# Patient Record
Sex: Male | Born: 1955
Health system: Southern US, Community
[De-identification: ages and names within clinical notes are randomized; demographics above are authoritative.]

## PROBLEM LIST (undated history)

## (undated) DIAGNOSIS — G4730 Sleep apnea, unspecified: Secondary | ICD-10-CM

## (undated) DIAGNOSIS — I10 Essential (primary) hypertension: Secondary | ICD-10-CM

## (undated) DIAGNOSIS — E119 Type 2 diabetes mellitus without complications: Secondary | ICD-10-CM

## (undated) DIAGNOSIS — J302 Other seasonal allergic rhinitis: Secondary | ICD-10-CM

---

## 1997-10-19 HISTORY — PX: NASAL FRACTURE SURGERY: SHX718

## 2018-10-24 DIAGNOSIS — Z7984 Long term (current) use of oral hypoglycemic drugs: Secondary | ICD-10-CM | POA: Diagnosis not present

## 2018-10-24 DIAGNOSIS — R1031 Right lower quadrant pain: Secondary | ICD-10-CM | POA: Diagnosis not present

## 2018-10-24 DIAGNOSIS — I10 Essential (primary) hypertension: Secondary | ICD-10-CM | POA: Diagnosis not present

## 2018-10-24 DIAGNOSIS — E1169 Type 2 diabetes mellitus with other specified complication: Secondary | ICD-10-CM | POA: Diagnosis not present

## 2018-11-08 DIAGNOSIS — R1031 Right lower quadrant pain: Secondary | ICD-10-CM | POA: Diagnosis not present

## 2018-11-29 DIAGNOSIS — R1031 Right lower quadrant pain: Secondary | ICD-10-CM | POA: Diagnosis not present

## 2018-12-01 ENCOUNTER — Other Ambulatory Visit: Payer: Self-pay | Admitting: General Surgery

## 2018-12-01 ENCOUNTER — Ambulatory Visit
Admission: RE | Admit: 2018-12-01 | Discharge: 2018-12-01 | Disposition: A | Discharge status: Home / Self Care | Payer: Self-pay | Source: Ambulatory Visit | Attending: General Surgery | Admitting: General Surgery

## 2018-12-01 DIAGNOSIS — N201 Calculus of ureter: Secondary | ICD-10-CM | POA: Diagnosis not present

## 2018-12-01 DIAGNOSIS — K4090 Unilateral inguinal hernia, without obstruction or gangrene, not specified as recurrent: Secondary | ICD-10-CM | POA: Diagnosis not present

## 2018-12-01 DIAGNOSIS — R1031 Right lower quadrant pain: Secondary | ICD-10-CM

## 2018-12-08 ENCOUNTER — Ambulatory Visit: Payer: Self-pay | Admitting: General Surgery

## 2019-01-11 ENCOUNTER — Inpatient Hospital Stay (HOSPITAL_COMMUNITY): Admission: RE | Admit: 2019-01-11 | Payer: Medicare HMO | Source: Ambulatory Visit

## 2019-01-19 ENCOUNTER — Encounter (HOSPITAL_BASED_OUTPATIENT_CLINIC_OR_DEPARTMENT_OTHER): Admission: RE | Payer: Self-pay | Source: Home / Self Care

## 2019-01-19 ENCOUNTER — Ambulatory Visit (HOSPITAL_BASED_OUTPATIENT_CLINIC_OR_DEPARTMENT_OTHER): Admission: RE | Admit: 2019-01-19 | Payer: Medicare HMO | Source: Home / Self Care | Admitting: General Surgery

## 2019-01-19 SURGERY — REPAIR, HERNIA, INGUINAL, BILATERAL, ADULT
Anesthesia: General | Laterality: Bilateral

## 2019-02-17 ENCOUNTER — Ambulatory Visit: Payer: Self-pay | Admitting: General Surgery

## 2019-04-12 ENCOUNTER — Encounter (HOSPITAL_BASED_OUTPATIENT_CLINIC_OR_DEPARTMENT_OTHER): Payer: Self-pay

## 2019-04-12 ENCOUNTER — Other Ambulatory Visit: Payer: Self-pay

## 2019-04-17 ENCOUNTER — Other Ambulatory Visit (HOSPITAL_COMMUNITY)
Admission: RE | Admit: 2019-04-17 | Discharge: 2019-04-17 | Disposition: A | Discharge status: Home / Self Care | Payer: Medicare HMO | Source: Ambulatory Visit | Attending: General Surgery | Admitting: General Surgery

## 2019-04-17 ENCOUNTER — Encounter (HOSPITAL_BASED_OUTPATIENT_CLINIC_OR_DEPARTMENT_OTHER)
Admission: RE | Admit: 2019-04-17 | Discharge: 2019-04-17 | Disposition: A | Discharge status: Home / Self Care | Payer: Medicare HMO | Source: Ambulatory Visit | Attending: General Surgery | Admitting: General Surgery

## 2019-04-17 ENCOUNTER — Other Ambulatory Visit: Payer: Self-pay

## 2019-04-17 DIAGNOSIS — Z1159 Encounter for screening for other viral diseases: Secondary | ICD-10-CM | POA: Diagnosis not present

## 2019-04-17 LAB — BASIC METABOLIC PANEL
Anion gap: 8 (ref 5–15)
BUN: 15 mg/dL (ref 8–23)
CO2: 26 mmol/L (ref 22–32)
Calcium: 9.3 mg/dL (ref 8.9–10.3)
Chloride: 105 mmol/L (ref 98–111)
Creatinine, Ser: 0.86 mg/dL (ref 0.61–1.24)
GFR calc Af Amer: >60 mL/min (ref >60)
GFR calc non Af Amer: >60 mL/min (ref >60)
Glucose, Bld: 128 mg/dL — ABNORMAL HIGH (ref 70–99)
Potassium: 4.6 mmol/L (ref 3.5–5.1)
Sodium: 139 mmol/L (ref 135–145)

## 2019-04-17 LAB — SARS CORONAVIRUS 2 (TAT 6-24 HRS): SARS Coronavirus 2: NEGATIVE

## 2019-04-17 NOTE — Progress Notes (Signed)
Gatorade 2 drink given with instructions to complete by 0400 dos, pt verbalized understanding.

## 2019-04-20 ENCOUNTER — Other Ambulatory Visit: Payer: Self-pay

## 2019-04-20 ENCOUNTER — Ambulatory Visit (HOSPITAL_BASED_OUTPATIENT_CLINIC_OR_DEPARTMENT_OTHER)
Admission: RE | Admit: 2019-04-20 | Discharge: 2019-04-20 | Disposition: A | Discharge status: Home / Self Care | Payer: Medicare HMO | Attending: General Surgery | Admitting: General Surgery

## 2019-04-20 ENCOUNTER — Ambulatory Visit (HOSPITAL_BASED_OUTPATIENT_CLINIC_OR_DEPARTMENT_OTHER): Payer: Medicare HMO | Admitting: Anesthesiology

## 2019-04-20 ENCOUNTER — Encounter (HOSPITAL_BASED_OUTPATIENT_CLINIC_OR_DEPARTMENT_OTHER): Payer: Self-pay | Admitting: Anesthesiology

## 2019-04-20 ENCOUNTER — Encounter (HOSPITAL_BASED_OUTPATIENT_CLINIC_OR_DEPARTMENT_OTHER)
Admission: RE | Disposition: A | Discharge status: Home / Self Care | Payer: Self-pay | Source: Home / Self Care | Attending: General Surgery

## 2019-04-20 DIAGNOSIS — Z7984 Long term (current) use of oral hypoglycemic drugs: Secondary | ICD-10-CM | POA: Diagnosis not present

## 2019-04-20 DIAGNOSIS — G4730 Sleep apnea, unspecified: Secondary | ICD-10-CM | POA: Diagnosis not present

## 2019-04-20 DIAGNOSIS — Z79899 Other long term (current) drug therapy: Secondary | ICD-10-CM | POA: Diagnosis not present

## 2019-04-20 DIAGNOSIS — I10 Essential (primary) hypertension: Secondary | ICD-10-CM | POA: Diagnosis not present

## 2019-04-20 DIAGNOSIS — E119 Type 2 diabetes mellitus without complications: Secondary | ICD-10-CM | POA: Diagnosis not present

## 2019-04-20 DIAGNOSIS — K4020 Bilateral inguinal hernia, without obstruction or gangrene, not specified as recurrent: Secondary | ICD-10-CM | POA: Insufficient documentation

## 2019-04-20 HISTORY — DX: Other seasonal allergic rhinitis: J30.2

## 2019-04-20 HISTORY — PX: INGUINAL HERNIA REPAIR: SHX194

## 2019-04-20 HISTORY — DX: Sleep apnea, unspecified: G47.30

## 2019-04-20 HISTORY — DX: Type 2 diabetes mellitus without complications: E11.9

## 2019-04-20 HISTORY — DX: Essential (primary) hypertension: I10

## 2019-04-20 LAB — GLUCOSE, CAPILLARY: Glucose-Capillary: 129 mg/dL — ABNORMAL HIGH (ref 70–99)

## 2019-04-20 SURGERY — REPAIR, HERNIA, INGUINAL, BILATERAL, ADULT
Anesthesia: General | Site: Groin | Laterality: Bilateral

## 2019-04-20 MED ORDER — PHENYLEPHRINE HCL (PRESSORS) 10 MG/ML IV SOLN
INTRAVENOUS | Status: DC | PRN
  Administered 2019-04-20 (×3): 80 ug via INTRAVENOUS

## 2019-04-20 MED ORDER — SODIUM CHLORIDE (PF) 0.9 % IJ SOLN
INTRAMUSCULAR | Status: AC
  Filled 2019-04-20: qty 10

## 2019-04-20 MED ORDER — BUPIVACAINE LIPOSOME 1.3 % IJ SUSP
INTRAMUSCULAR | Status: AC
  Filled 2019-04-20: qty 20

## 2019-04-20 MED ORDER — BUPIVACAINE-EPINEPHRINE (PF) 0.25% -1:200000 IJ SOLN
INTRAMUSCULAR | Status: AC
  Filled 2019-04-20: qty 90

## 2019-04-20 MED ORDER — HYDROCODONE-ACETAMINOPHEN 5-325 MG PO TABS
1.0000 | ORAL_TABLET | Freq: Once | ORAL | Status: AC
  Administered 2019-04-20: 1 via ORAL

## 2019-04-20 MED ORDER — FENTANYL CITRATE (PF) 100 MCG/2ML IJ SOLN: 50.0000 ug | INTRAMUSCULAR | Status: DC | PRN

## 2019-04-20 MED ORDER — ROCURONIUM BROMIDE 10 MG/ML (PF) SYRINGE
PREFILLED_SYRINGE | INTRAVENOUS | Status: AC
  Filled 2019-04-20: qty 10

## 2019-04-20 MED ORDER — METOCLOPRAMIDE HCL 5 MG/ML IJ SOLN: 10.0000 mg | Freq: Once | INTRAMUSCULAR | Status: DC | PRN

## 2019-04-20 MED ORDER — BUPIVACAINE-EPINEPHRINE 0.25% -1:200000 IJ SOLN
INTRAMUSCULAR | Status: DC | PRN
  Administered 2019-04-20: 10 mL

## 2019-04-20 MED ORDER — PROPOFOL 10 MG/ML IV BOLUS
INTRAVENOUS | Status: DC | PRN
  Administered 2019-04-20: 150 mg via INTRAVENOUS

## 2019-04-20 MED ORDER — EPHEDRINE SULFATE 50 MG/ML IJ SOLN
INTRAMUSCULAR | Status: DC | PRN
  Administered 2019-04-20: 25 mg via INTRAVENOUS
  Administered 2019-04-20 (×2): 10 mg via INTRAVENOUS

## 2019-04-20 MED ORDER — CEFAZOLIN SODIUM-DEXTROSE 2-4 GM/100ML-% IV SOLN
2.0000 g | INTRAVENOUS | Status: AC
  Administered 2019-04-20: 08:00:00 2 g via INTRAVENOUS

## 2019-04-20 MED ORDER — DEXAMETHASONE SODIUM PHOSPHATE 4 MG/ML IJ SOLN
INTRAMUSCULAR | Status: DC | PRN
  Administered 2019-04-20: 10 mg via INTRAVENOUS

## 2019-04-20 MED ORDER — ACETAMINOPHEN 500 MG PO TABS
1000.0000 mg | ORAL_TABLET | ORAL | Status: AC
  Administered 2019-04-20: 1000 mg via ORAL

## 2019-04-20 MED ORDER — CHLORHEXIDINE GLUCONATE CLOTH 2 % EX PADS: 6.0000 | MEDICATED_PAD | Freq: Once | CUTANEOUS | Status: DC

## 2019-04-20 MED ORDER — FENTANYL CITRATE (PF) 100 MCG/2ML IJ SOLN
INTRAMUSCULAR | Status: DC | PRN
  Administered 2019-04-20: 100 ug via INTRAVENOUS

## 2019-04-20 MED ORDER — FENTANYL CITRATE (PF) 100 MCG/2ML IJ SOLN
INTRAMUSCULAR | Status: AC
  Filled 2019-04-20: qty 2

## 2019-04-20 MED ORDER — MEPERIDINE HCL 25 MG/ML IJ SOLN: 6.2500 mg | INTRAMUSCULAR | Status: DC | PRN

## 2019-04-20 MED ORDER — LIDOCAINE 2% (20 MG/ML) 5 ML SYRINGE
INTRAMUSCULAR | Status: DC | PRN
  Administered 2019-04-20: 50 mg via INTRAVENOUS

## 2019-04-20 MED ORDER — GABAPENTIN 300 MG PO CAPS
300.0000 mg | ORAL_CAPSULE | ORAL | Status: AC
  Administered 2019-04-20: 300 mg via ORAL

## 2019-04-20 MED ORDER — CELECOXIB 200 MG PO CAPS
ORAL_CAPSULE | ORAL | Status: AC
  Filled 2019-04-20: qty 1

## 2019-04-20 MED ORDER — MIDAZOLAM HCL 2 MG/2ML IJ SOLN: 1.0000 mg | INTRAMUSCULAR | Status: DC | PRN

## 2019-04-20 MED ORDER — ROCURONIUM BROMIDE 100 MG/10ML IV SOLN
INTRAVENOUS | Status: DC | PRN
  Administered 2019-04-20: 60 mg via INTRAVENOUS
  Administered 2019-04-20: 20 mg via INTRAVENOUS

## 2019-04-20 MED ORDER — ACETAMINOPHEN 500 MG PO TABS
ORAL_TABLET | ORAL | Status: AC
  Filled 2019-04-20: qty 2

## 2019-04-20 MED ORDER — MIDAZOLAM HCL 2 MG/2ML IJ SOLN
INTRAMUSCULAR | Status: AC
  Filled 2019-04-20: qty 2

## 2019-04-20 MED ORDER — LACTATED RINGERS IV SOLN
INTRAVENOUS | Status: DC
  Administered 2019-04-20 (×3): via INTRAVENOUS

## 2019-04-20 MED ORDER — CEFAZOLIN SODIUM-DEXTROSE 2-4 GM/100ML-% IV SOLN
INTRAVENOUS | Status: AC
  Filled 2019-04-20: qty 100

## 2019-04-20 MED ORDER — ONDANSETRON HCL 4 MG/2ML IJ SOLN
INTRAMUSCULAR | Status: DC | PRN
  Administered 2019-04-20: 4 mg via INTRAVENOUS

## 2019-04-20 MED ORDER — BUPIVACAINE LIPOSOME 1.3 % IJ SUSP
INTRAMUSCULAR | Status: DC | PRN
  Administered 2019-04-20: 20 mL

## 2019-04-20 MED ORDER — HYDROCODONE-ACETAMINOPHEN 5-325 MG PO TABS: 1.0000 | ORAL_TABLET | Freq: Four times a day (QID) | ORAL | 0 refills | Status: AC | PRN

## 2019-04-20 MED ORDER — HYDROCODONE-ACETAMINOPHEN 5-325 MG PO TABS
ORAL_TABLET | ORAL | Status: AC
  Filled 2019-04-20: qty 1

## 2019-04-20 MED ORDER — LIDOCAINE 2% (20 MG/ML) 5 ML SYRINGE
INTRAMUSCULAR | Status: AC
  Filled 2019-04-20: qty 5

## 2019-04-20 MED ORDER — DEXAMETHASONE SODIUM PHOSPHATE 10 MG/ML IJ SOLN
INTRAMUSCULAR | Status: AC
  Filled 2019-04-20: qty 1

## 2019-04-20 MED ORDER — MIDAZOLAM HCL 5 MG/5ML IJ SOLN
INTRAMUSCULAR | Status: DC | PRN
  Administered 2019-04-20: 2 mg via INTRAVENOUS

## 2019-04-20 MED ORDER — PROPOFOL 10 MG/ML IV BOLUS
INTRAVENOUS | Status: AC
  Filled 2019-04-20: qty 20

## 2019-04-20 MED ORDER — ONDANSETRON HCL 4 MG/2ML IJ SOLN
INTRAMUSCULAR | Status: AC
  Filled 2019-04-20: qty 2

## 2019-04-20 MED ORDER — SUGAMMADEX SODIUM 200 MG/2ML IV SOLN
INTRAVENOUS | Status: DC | PRN
  Administered 2019-04-20: 173 mg via INTRAVENOUS

## 2019-04-20 MED ORDER — METHYLENE BLUE 0.5 % INJ SOLN
INTRAVENOUS | Status: AC
  Filled 2019-04-20: qty 10

## 2019-04-20 MED ORDER — CELECOXIB 200 MG PO CAPS
200.0000 mg | ORAL_CAPSULE | ORAL | Status: AC
  Administered 2019-04-20: 07:00:00 200 mg via ORAL

## 2019-04-20 MED ORDER — FENTANYL CITRATE (PF) 100 MCG/2ML IJ SOLN
25.0000 ug | INTRAMUSCULAR | Status: DC | PRN
  Administered 2019-04-20: 25 ug via INTRAVENOUS

## 2019-04-20 MED ORDER — GABAPENTIN 300 MG PO CAPS
ORAL_CAPSULE | ORAL | Status: AC
  Filled 2019-04-20: qty 1

## 2019-04-20 SURGICAL SUPPLY — 53 items
BENZOIN TINCTURE PRP APPL 2/3 (GAUZE/BANDAGES/DRESSINGS) IMPLANT
BLADE CLIPPER SURG (BLADE) ×1 IMPLANT
BLADE HEX COATED 2.75 (ELECTRODE) ×2 IMPLANT
BLADE SURG 15 STRL LF DISP TIS (BLADE) ×1 IMPLANT
BLADE SURG 15 STRL SS (BLADE) ×1
CHLORAPREP W/TINT 26 (MISCELLANEOUS) ×2 IMPLANT
COVER BACK TABLE REUSABLE LG (DRAPES) ×2 IMPLANT
COVER MAYO STAND REUSABLE (DRAPES) ×2 IMPLANT
COVER WAND RF STERILE (DRAPES) IMPLANT
DECANTER SPIKE VIAL GLASS SM (MISCELLANEOUS) IMPLANT
DERMABOND ADVANCED (GAUZE/BANDAGES/DRESSINGS) ×1
DERMABOND ADVANCED .7 DNX12 (GAUZE/BANDAGES/DRESSINGS) ×1 IMPLANT
DRAIN PENROSE 1/2X12 LTX STRL (WOUND CARE) ×2 IMPLANT
DRAPE LAPAROTOMY TRNSV 102X78 (DRAPES) ×2 IMPLANT
DRAPE UTILITY XL STRL (DRAPES) ×2 IMPLANT
ELECT REM PT RETURN 9FT ADLT (ELECTROSURGICAL) ×2
ELECTRODE REM PT RTRN 9FT ADLT (ELECTROSURGICAL) ×1 IMPLANT
GLOVE BIO SURGEON STRL SZ7.5 (GLOVE) ×2 IMPLANT
GLOVE BIOGEL PI IND STRL 7.0 (GLOVE) IMPLANT
GLOVE BIOGEL PI INDICATOR 7.0 (GLOVE) ×1
GLOVE EXAM NITRILE MD LF STRL (GLOVE) ×1 IMPLANT
GLOVE SURG SYN 7.5  E (GLOVE) ×1
GLOVE SURG SYN 7.5 E (GLOVE) ×1 IMPLANT
GLOVE SURG SYN 7.5 PF PI (GLOVE) IMPLANT
GOWN STRL REUS W/ TWL LRG LVL3 (GOWN DISPOSABLE) ×2 IMPLANT
GOWN STRL REUS W/ TWL XL LVL3 (GOWN DISPOSABLE) IMPLANT
GOWN STRL REUS W/TWL LRG LVL3 (GOWN DISPOSABLE) ×1
GOWN STRL REUS W/TWL XL LVL3 (GOWN DISPOSABLE) ×1
MESH ULTRAPRO 3X6 7.6X15CM (Mesh General) ×2 IMPLANT
NDL HYPO 25X1 1.5 SAFETY (NEEDLE) ×1 IMPLANT
NEEDLE HYPO 22GX1.5 SAFETY (NEEDLE) IMPLANT
NEEDLE HYPO 25X1 1.5 SAFETY (NEEDLE) ×2 IMPLANT
NS IRRIG 1000ML POUR BTL (IV SOLUTION) ×2 IMPLANT
PACK BASIN DAY SURGERY FS (CUSTOM PROCEDURE TRAY) ×2 IMPLANT
PENCIL BUTTON HOLSTER BLD 10FT (ELECTRODE) ×2 IMPLANT
SLEEVE SCD COMPRESS KNEE MED (MISCELLANEOUS) ×2 IMPLANT
SPONGE LAP 18X18 RF (DISPOSABLE) ×2 IMPLANT
STRIP CLOSURE SKIN 1/2X4 (GAUZE/BANDAGES/DRESSINGS) IMPLANT
SUT MON AB 4-0 PC3 18 (SUTURE) ×3 IMPLANT
SUT PROLENE 2 0 SH DA (SUTURE) ×8 IMPLANT
SUT SILK 2 0 SH (SUTURE) ×2 IMPLANT
SUT SILK 3 0 SH 30 (SUTURE) IMPLANT
SUT SILK 3 0 TIES 17X18 (SUTURE) ×1
SUT SILK 3-0 18XBRD TIE BLK (SUTURE) ×1 IMPLANT
SUT VIC AB 0 SH 27 (SUTURE) ×2 IMPLANT
SUT VIC AB 2-0 SH 27 (SUTURE) ×2
SUT VIC AB 2-0 SH 27XBRD (SUTURE) ×1 IMPLANT
SUT VIC AB 3-0 SH 27 (SUTURE) ×2
SUT VIC AB 3-0 SH 27X BRD (SUTURE) ×1 IMPLANT
SYR CONTROL 10ML LL (SYRINGE) ×2 IMPLANT
TOWEL GREEN STERILE FF (TOWEL DISPOSABLE) ×2 IMPLANT
TUBE CONNECTING 20X1/4 (TUBING) ×1 IMPLANT
YANKAUER SUCT BULB TIP NO VENT (SUCTIONS) ×1 IMPLANT

## 2019-04-20 NOTE — Anesthesia Procedure Notes (Signed)
Procedure Name: Intubation Date/Time: 04/20/2019 8:20 AM Performed by: Marrianne Mood, CRNA Pre-anesthesia Checklist: Patient identified, Emergency Drugs available, Suction available, Patient being monitored and Timeout performed Patient Re-evaluated:Patient Re-evaluated prior to induction Oxygen Delivery Method: Circle system utilized Preoxygenation: Pre-oxygenation with 100% oxygen Induction Type: IV induction Ventilation: Mask ventilation without difficulty Laryngoscope Size: Glidescope and 3 Grade View: Grade IV Tube type: Oral Tube size: 8.0 mm Number of attempts: 2 Airway Equipment and Method: Stylet and Oral airway Placement Confirmation: ETT inserted through vocal cords under direct vision,  positive ETCO2 and breath sounds checked- equal and bilateral Secured at: 22 cm Tube secured with: Tape Dental Injury: Teeth and Oropharynx as per pre-operative assessment  Difficulty Due To: Difficulty was unanticipated, Difficult Airway- due to reduced neck mobility and Difficult Airway- due to limited oral opening

## 2019-04-20 NOTE — Op Note (Signed)
04/20/2019  10:35 AM  PATIENT:  Francisco Soto  63 y.o. male  PRE-OPERATIVE DIAGNOSIS:  BILATERAL INGUINAL HERNIAS  POST-OPERATIVE DIAGNOSIS:  BILATERAL INGUINAL HERNIAS  PROCEDURE:  Procedure(s): BILATERAL INGUINAL HERNIA REPAIR WITH MESH (Bilateral)  SURGEON:  Surgeon(s) and Role:    * Jovita Kussmaul, MD - Primary  PHYSICIAN ASSISTANT:   ASSISTANTS: none   ANESTHESIA:   local and general  EBL:  30 mL   BLOOD ADMINISTERED:none  DRAINS: none   LOCAL MEDICATIONS USED:  MARCAINE     SPECIMEN:  No Specimen  DISPOSITION OF SPECIMEN:  N/A  COUNTS:  YES  TOURNIQUET:  * No tourniquets in log *  DICTATION: .Dragon Dictation   After informed consent was obtained the patient was brought to the operating room and placed in the supine position on the operating table.  After adequate induction of general anesthesia the patient's bilateral groin and abdomen area were prepped with ChloraPrep, allowed to dry, and draped in usual sterile manner.  An appropriate timeout was performed.  Both groins were then infiltrated with quarter percent Marcaine.  Attention was first turned to the right side.  A small incision was made from the edge of the pubic tubercle on the right towards the anterior superior iliac spine with a 15 blade knife.  The incision was carried through the skin and subcutaneous tissue sharply with the electrocautery until the fascia of the external oblique was encountered.  A small bridging vein was clamped with hemostats, divided, and ligated with 3-0 silk ties.  The external oblique fascia was then opened along its fibers towards the apex of the external ring with a 15 blade knife and Metzenbaum scissors.  Male [place] retractor was deployed.  Blunt dissection was carried out of the cord structures until they could be surrounded between 2 fingers.  Half inch Penrose drain was placed around the cord structures for retraction purposes.  The cord was then gently skeletonized.  There  was a significant lipoma of the cord that was removed.  The patient appeared to have more of a direct hernia.  During the dissection the ilioinguinal nerve was identified and it seemed to be involved with some scar tissue.  The nerve was dissected out proximally distally, clamped, divided, and ligated with 3-0 silk ties.  The hernia was gently separated from the cord structures taking care to preserve the vas deferens and testicular artery.  The hernia was broad-based coming through the floor of the canal.  The hernia was reduced and the floor of the canal was repaired with interrupted 0 Vicryl stitches.  A 3 x 6 piece of ultra Pro mesh was then chosen and cut to the appropriate size.  The mesh was sewed inferiorly to the shelving edge of the inguinal ligament with a running 2-0 Prolene stitch.  Tails were cut in the mesh laterally and the tails were wrapped around the cord structures.  Superiorly the mesh was sewed to the musculoaponeurotic strength of the transversalis with interrupted 2-0 Prolene vertical mattress stitches.  Lateral to the cord the tails of the mesh were anchored to the shelving edge of the inguinal ligament with an interrupted 2-0 Prolene stitch.  Once this was accomplished the mesh appeared to be in good position the hernia seem well repaired.  The wound was irrigated with copious amounts of saline.  The external oblique fascia was then reapproximated with a running 2-0 Vicryl stitch.  The wound was infiltrated with Exparel.  The subcutaneous fascia was then  closed with a running 3-0 Vicryl stitch.  The skin was closed with a running 4-0 Monocryl subcuticular stitch.  Attention was then turned to the left breast.  A similar incision was made from the edge of the pubic tubercle towards the anterior superior iliac spine.  The incision was carried through the skin and subcutaneous tissue sharply with electrocautery until the fascia of the external oblique was encountered.  2 small bridging veins  were clamped with hemostats, divided, and ligated with 3-0 silk ties.  The external oblique fascia was opened along its fibers towards the apex of the external ring with a 15 blade knife and Metzenbaum scissors.  A [place] retractor was deployed.  Blunt dissection was carried out of the cord structures until they could be surrounded between 2 fingers.  1/2 inch Penrose drain was placed around the cord structures for retraction purposes.  The cord was then gently skeletonized by blunt dissection.  A lipoma of the cord was again identified and separated from the cord structures and removed.  The hernia seem to be coming through the floor the canal and was very broad-based.  Because of this it was reduced and the floor the canal was repaired with interrupted 0 Vicryl stitches.  A 3 x 6 piece of ultra Pro mesh was chosen and cut to the appropriate size.  The mesh was sewed inferiorly to the shelving edge of the inguinal ligament with a running 2-0 Prolene stitch.  Tails were cut in the mesh laterally and the tails were wrapped around the cord structures.  Superiorly the mesh was sewed to the musculoaponeurotic strength of the transversalis with interrupted 2-0 Prolene vertical mattress stitches.  Lateral to the cord the tails of the mesh were anchored to the shelving edge of the lingual ligament with interrupted 2-0 Prolene stitch.  Once this was accomplished the mesh appeared to be in good position and the hernia seem well repaired.  The wound was irrigated with copious amounts of saline.  The ilioinguinal nerve was identified during the dissection and appeared to be involved with scar tissue.  The nerve was dissected out proximally and distally, clamped, divided, and ligated with 3-0 silk ties.  Next the wound was infiltrated with Exparel.  The external oblique fascia was reapproximated with a running 2-0 Vicryl stitch.  The subcutaneous fascia was closed with a running 3-0 Vicryl stitch.  The skin was then closed  with a running 4-0 Monocryl subcuticular stitch.  Dermabond dressings were applied.  The patient tolerated the procedure well.  At the end of the case all needle sponge and instrument counts were correct.  The patient was then awakened and taken to recovery in stable condition.  The patient's testicles were in the scrotum at the end of the case.  PLAN OF CARE: Discharge to home after PACU  PATIENT DISPOSITION:  PACU - hemodynamically stable.   Delay start of Pharmacological VTE agent (>24hrs) due to surgical blood loss or risk of bleeding: not applicable

## 2019-04-20 NOTE — Anesthesia Preprocedure Evaluation (Signed)
Anesthesia Evaluation  Patient identified by MRN, date of birth, ID band Patient awake    Reviewed: Allergy & Precautions, NPO status , Patient's Chart, lab work & pertinent test results  Airway Mallampati: II  TM Distance: >3 FB Neck ROM: Full    Dental no notable dental hx.    Pulmonary sleep apnea and Continuous Positive Airway Pressure Ventilation ,    Pulmonary exam normal breath sounds clear to auscultation       Cardiovascular hypertension, Pt. on medications Normal cardiovascular exam Rhythm:Regular Rate:Normal     Neuro/Psych negative neurological ROS  negative psych ROS   GI/Hepatic negative GI ROS, Neg liver ROS,   Endo/Other  diabetes, Type 2, Oral Hypoglycemic Agents  Renal/GU negative Renal ROS  negative genitourinary   Musculoskeletal negative musculoskeletal ROS (+)   Abdominal   Peds negative pediatric ROS (+)  Hematology negative hematology ROS (+)   Anesthesia Other Findings   Reproductive/Obstetrics negative OB ROS                             Anesthesia Physical Anesthesia Plan  ASA: II  Anesthesia Plan: General   Post-op Pain Management:    Induction: Intravenous  PONV Risk Score and Plan: 2 and Ondansetron and Treatment may vary due to age or medical condition  Airway Management Planned: Oral ETT  Additional Equipment:   Intra-op Plan:   Post-operative Plan: Extubation in OR  Informed Consent: I have reviewed the patients History and Physical, chart, labs and discussed the procedure including the risks, benefits and alternatives for the proposed anesthesia with the patient or authorized representative who has indicated his/her understanding and acceptance.     Dental advisory given  Plan Discussed with: CRNA  Anesthesia Plan Comments:         Anesthesia Quick Evaluation

## 2019-04-20 NOTE — Interval H&P Note (Signed)
History and Physical Interval Note:  04/20/2019 7:41 AM  Francisco Soto  has presented today for surgery, with the diagnosis of [place].  The various methods of treatment have been discussed with the patient and family. After consideration of risks, benefits and other options for treatment, the patient has consented to  Procedure(s): BILATERAL INGUINAL HERNIA REPAIR WITH MESH (Bilateral) as a surgical intervention.  The patient's history has been reviewed, patient examined, no change in status, stable for surgery.  I have reviewed the patient's chart and labs.  Questions were answered to the patient's satisfaction.     Autumn Messing III

## 2019-04-20 NOTE — H&P (Signed)
Francisco Soto  Location: [facility] Patient #: [id number] DOB: 1956-01-11 Married / Language: English / Race: White Male   History of Present Illness The patient is a 63 year old male who presents with abdominal pain. We're asked to see the patient in consultation by Dr. Jori Moll polite to evaluate him for a right inguinal hernia. The patient is a 63 year old white male who for the last several months has been experiencing intermittent discomfort in the right groin. It has been painful at times. He states that sometimes he gets a very sharp severe pain somebody is stabbing him. He denies any fevers or chills. He denies any nausea or vomiting. His appetite is good and his bowels are working normally. He has not noticed a bulge in the right groin. He has taken a couple of Cruises recently but had trouble doing a lot of walking because of the discomfort.   Allergies No Known Drug Allergies  Allergies Reconciled   Medication History  Lisinopril (10MG  Tablet, Oral) Active. metFORMIN HCl (500MG  Tablet, Oral) Active. Claritin (10MG  Tablet, Oral) Active. Medications Reconciled    Review of Systems  General Not Present- Appetite Loss, Chills, Fatigue, Fever, Night Sweats, Weight Gain and Weight Loss. Note: All other systems negative (unless as noted in HPI & included Review of Systems) Skin Not Present- Change in Wart/Mole, Dryness, Hives, Jaundice, New Lesions, Non-Healing Wounds, Rash and Ulcer. HEENT Not Present- Earache, Hearing Loss, Hoarseness, Nose Bleed, Oral Ulcers, Ringing in the Ears, Seasonal Allergies, Sinus Pain, Sore Throat, Visual Disturbances, Wears glasses/contact lenses and Yellow Eyes. Respiratory Not Present- Bloody sputum, Chronic Cough, Difficulty Breathing, Snoring and Wheezing. Breast Not Present- Breast Mass, Breast Pain, Nipple Discharge and Skin Changes. Cardiovascular Not Present- Chest Pain, Difficulty Breathing Lying Down, Leg Cramps,  Palpitations, Rapid Heart Rate, Shortness of Breath and Swelling of Extremities. Gastrointestinal Not Present- Abdominal Pain, Bloating, Bloody Stool, Change in Bowel Habits, Chronic diarrhea, Constipation, Difficulty Swallowing, Excessive gas, Gets full quickly at meals, Hemorrhoids, Indigestion, Nausea, Rectal Pain and Vomiting. Male Genitourinary Not Present- Blood in Urine, Change in Urinary Stream, Frequency, Impotence, Nocturia, Painful Urination, Urgency and Urine Leakage. Musculoskeletal Not Present- Back Pain, Joint Pain, Joint Stiffness, Muscle Pain, Muscle Weakness and Swelling of Extremities. Neurological Not Present- Decreased Memory, Fainting, Headaches, Numbness, Seizures, Tingling, Tremor, Trouble walking and Weakness. Psychiatric Not Present- Anxiety, Bipolar, Change in Sleep Pattern, Depression, Fearful and Frequent crying. Endocrine Not Present- Cold Intolerance, Excessive Hunger, Hair Changes, Heat Intolerance and New Diabetes. Hematology Not Present- Easy Bruising, Excessive bleeding, Gland problems, HIV and Persistent Infections.  Vitals  Weight: 196 lb Height: 76in Body Surface Area: 2.2 m Body Mass Index: 23.86 kg/m  Temp.: 98.11F  Pulse: 89 (Regular)  BP: 136/84(Sitting, Left Arm, Standard)       Physical Exam  General Mental Status-Alert. General Appearance-Consistent with stated age. Hydration-Well hydrated. Voice-Normal.  Head and Neck Head-normocephalic, atraumatic with no lesions or palpable masses. Trachea-midline. Thyroid Gland Characteristics - normal size and consistency.  Eye Eyeball - Bilateral-Extraocular movements intact. Sclera/Conjunctiva - Bilateral-No scleral icterus.  Chest and Lung Exam Chest and lung exam reveals -quiet, even and easy respiratory effort with no use of accessory muscles and on auscultation, normal breath sounds, no adventitious sounds and normal vocal resonance. Inspection Chest Wall -  Normal. Back - normal.  Cardiovascular Cardiovascular examination reveals -normal heart sounds, regular rate and rhythm with no murmurs and normal pedal pulses bilaterally.  Abdomen Inspection Inspection of the abdomen reveals - No Hernias. Skin -  Scar - no surgical scars. Palpation/Percussion Palpation and Percussion of the abdomen reveal - Soft, Non Tender, No Rebound tenderness, No Rigidity (guarding) and No hepatosplenomegaly. Auscultation Auscultation of the abdomen reveals - Bowel sounds normal.  Male Genitourinary Note: There is no palpable bulge in either groin. There is a palpable impulse with straining which may be just the abdominal wall muscles contracting and this is symmetric bilaterally   Neurologic Neurologic evaluation reveals -alert and oriented x 3 with no impairment of recent or remote memory. Mental Status-Normal.  Musculoskeletal Normal Exam - Left-Upper Extremity Strength Normal and Lower Extremity Strength Normal. Normal Exam - Right-Upper Extremity Strength Normal and Lower Extremity Strength Normal.  Lymphatic Head & Neck  General Head & Neck Lymphatics: Bilateral - Description - Normal. Axillary  General Axillary Region: Bilateral - Description - Normal. Tenderness - Non Tender. Femoral & Inguinal  Generalized Femoral & Inguinal Lymphatics: Bilateral - Description - Normal. Tenderness - Non Tender.    Assessment & Plan  RIGHT GROIN PAIN (R10.31) Impression: The patient has been experiencing right groin pain. The location of the pain is consistent with a right inguinal hernia although on physical exam is difficult to tell if he truly has a hernia or not. Because of this I would recommend evaluating him with a CT scan of the pelvis. If we see radiographic evidence of a hernia than I would feel much more comfortable recommending fixing this. I have discussed with him in detail the risks and benefits of the operation as well as some of the  technical aspects including the risk of chronic pain and the use of mesh and he understands and wishes to proceed and I will call him with the results of the CT scan.  Current Plans CT PELVIS WO CON (40370) Follow Up - Call [facility] office after tests / studies doneto discuss further plans  CT shows small bilateral inguinal hernias. Plan for repair of both with mesh

## 2019-04-20 NOTE — Transfer of Care (Signed)
Immediate Anesthesia Transfer of Care Note  Patient: Francisco Soto  Procedure(s) Performed: BILATERAL INGUINAL HERNIA REPAIR WITH MESH (Bilateral Groin)  Patient Location: PACU  Anesthesia Type:General  Level of Consciousness: awake and patient cooperative  Airway & Oxygen Therapy: Patient Spontanous Breathing and Patient connected to face mask oxygen  Post-op Assessment: Report given to RN and Post -op Vital signs reviewed and stable  Post vital signs: Reviewed and stable  Last Vitals:  Vitals Value Taken Time  BP 161/88 04/20/19 1055  Temp    Pulse 90 04/20/19 1057  Resp 21 04/20/19 1057  SpO2 100 % 04/20/19 1057  Vitals shown include unvalidated device data.  Last Pain:  Vitals:   04/20/19 0711  TempSrc: Oral  PainSc: 0-No pain         Complications: No apparent anesthesia complications

## 2019-04-20 NOTE — Discharge Instructions (Signed)
No Tylenol or ibuprofen before 3:30pm    Post Anesthesia Home Care Instructions  Activity: Get plenty of rest for the remainder of the day. A responsible individual must stay with you for 24 hours following the procedure.  For the next 24 hours, DO NOT: -Drive a car -Paediatric nurse -Drink alcoholic beverages -Take any medication unless instructed by your physician -Make any legal decisions or sign important papers.  Meals: Start with liquid foods such as gelatin or soup. Progress to regular foods as tolerated. Avoid greasy, spicy, heavy foods. If nausea and/or vomiting occur, drink only clear liquids until the nausea and/or vomiting subsides. Call your physician if vomiting continues.  Special Instructions/Symptoms: Your throat may feel dry or sore from the anesthesia or the breathing tube placed in your throat during surgery. If this causes discomfort, gargle with warm salt water. The discomfort should disappear within 24 hours.  If you had a scopolamine patch placed behind your ear for the management of post- operative nausea and/or vomiting:  1. The medication in the patch is effective for 72 hours, after which it should be removed.  Wrap patch in a tissue and discard in the trash. Wash hands thoroughly with soap and water. 2. You may remove the patch earlier than 72 hours if you experience unpleasant side effects which may include dry mouth, dizziness or visual disturbances. 3. Avoid touching the patch. Wash your hands with soap and water after contact with the patch.      Information for Discharge Teaching: EXPAREL (bupivacaine liposome injectable suspension)   Your surgeon or anesthesiologist gave you EXPAREL(bupivacaine) to help control your pain after surgery.   EXPAREL is a local anesthetic that provides pain relief by numbing the tissue around the surgical site.  EXPAREL is designed to release pain medication over time and can control pain for up to 72  hours.  Depending on how you respond to EXPAREL, you may require less pain medication during your recovery.  Possible side effects:  Temporary loss of sensation or ability to move in the area where bupivacaine was injected.  Nausea, vomiting, constipation  Rarely, numbness and tingling in your mouth or lips, lightheadedness, or anxiety may occur.  Call your doctor right away if you think you may be experiencing any of these sensations, or if you have other questions regarding possible side effects.  Follow all other discharge instructions given to you by your surgeon or nurse. Eat a healthy diet and drink plenty of water or other fluids.  If you return to the hospital for any reason within 96 hours following the administration of EXPAREL, it is important for health care providers to know that you have received this anesthetic. A teal colored band has been placed on your arm with the date, time and amount of EXPAREL you have received in order to alert and inform your health care providers. Please leave this armband in place for the full 96 hours following administration, and then you may remove the band.

## 2019-04-20 NOTE — Anesthesia Postprocedure Evaluation (Signed)
Anesthesia Post Note  Patient: Francisco Soto  Procedure(s) Performed: BILATERAL INGUINAL HERNIA REPAIR WITH MESH (Bilateral Groin)     Patient location during evaluation: PACU Anesthesia Type: General Level of consciousness: awake and alert Pain management: pain level controlled Vital Signs Assessment: post-procedure vital signs reviewed and stable Respiratory status: spontaneous breathing, nonlabored ventilation, respiratory function stable and patient connected to nasal cannula oxygen Cardiovascular status: blood pressure returned to baseline and stable Postop Assessment: no apparent nausea or vomiting Anesthetic complications: no    Last Vitals:  Vitals:   04/20/19 1142 04/20/19 1245  BP: (!) 160/90 (!) 168/91  Pulse: 78 80  Resp: 19 18  Temp:  36.4 C  SpO2: 98% 96%    Last Pain:  Vitals:   04/20/19 1245  TempSrc:   PainSc: 3                  Montez Hageman

## 2019-04-21 LAB — GLUCOSE, CAPILLARY: Glucose-Capillary: 217 mg/dL — ABNORMAL HIGH (ref 70–99)

## 2019-04-24 ENCOUNTER — Encounter (HOSPITAL_BASED_OUTPATIENT_CLINIC_OR_DEPARTMENT_OTHER): Payer: Self-pay | Admitting: General Surgery

## 2019-06-09 DIAGNOSIS — R69 Illness, unspecified: Secondary | ICD-10-CM | POA: Diagnosis not present

## 2019-06-28 DIAGNOSIS — R69 Illness, unspecified: Secondary | ICD-10-CM | POA: Diagnosis not present

## 2019-06-30 DIAGNOSIS — R82998 Other abnormal findings in urine: Secondary | ICD-10-CM | POA: Diagnosis not present

## 2019-06-30 DIAGNOSIS — I10 Essential (primary) hypertension: Secondary | ICD-10-CM | POA: Diagnosis not present

## 2019-06-30 DIAGNOSIS — E119 Type 2 diabetes mellitus without complications: Secondary | ICD-10-CM | POA: Diagnosis not present

## 2019-06-30 DIAGNOSIS — Z0000 Encounter for general adult medical examination without abnormal findings: Secondary | ICD-10-CM | POA: Diagnosis not present

## 2019-06-30 DIAGNOSIS — M109 Gout, unspecified: Secondary | ICD-10-CM | POA: Diagnosis not present

## 2019-06-30 DIAGNOSIS — C4490 Unspecified malignant neoplasm of skin, unspecified: Secondary | ICD-10-CM | POA: Diagnosis not present

## 2019-06-30 DIAGNOSIS — J302 Other seasonal allergic rhinitis: Secondary | ICD-10-CM | POA: Diagnosis not present

## 2019-07-03 DIAGNOSIS — R69 Illness, unspecified: Secondary | ICD-10-CM | POA: Diagnosis not present

## 2019-08-15 DIAGNOSIS — H10413 Chronic giant papillary conjunctivitis, bilateral: Secondary | ICD-10-CM | POA: Diagnosis not present

## 2019-08-15 DIAGNOSIS — H04123 Dry eye syndrome of bilateral lacrimal glands: Secondary | ICD-10-CM | POA: Diagnosis not present

## 2019-08-15 DIAGNOSIS — E119 Type 2 diabetes mellitus without complications: Secondary | ICD-10-CM | POA: Diagnosis not present

## 2019-08-15 DIAGNOSIS — H2513 Age-related nuclear cataract, bilateral: Secondary | ICD-10-CM | POA: Diagnosis not present

## 2019-09-30 DIAGNOSIS — R69 Illness, unspecified: Secondary | ICD-10-CM | POA: Diagnosis not present

## 2019-11-16 ENCOUNTER — Ambulatory Visit: Payer: Medicare HMO

## 2019-11-25 ENCOUNTER — Ambulatory Visit: Payer: Medicare HMO | Attending: Internal Medicine

## 2019-11-25 DIAGNOSIS — Z23 Encounter for immunization: Secondary | ICD-10-CM

## 2019-11-25 NOTE — Progress Notes (Signed)
   Covid-19 Vaccination Clinic  Name:  Francisco Soto    MRN: [id number] DOB: May 04, 1956  11/25/2019  Mr. Francisco Soto was observed post Covid-19 immunization for 15 minutes without incidence. He was provided with Vaccine Information Sheet and instruction to access the V-Safe system.   Francisco Soto was instructed to call 911 with any severe reactions post vaccine: Marland Kitchen Difficulty breathing  . Swelling of your face and throat  . A fast heartbeat  . A bad rash all over your body  . Dizziness and weakness    Immunizations Administered    Name Date Dose VIS Date Route   Pfizer COVID-19 Vaccine 11/25/2019  9:17 AM 0.3 mL 09/29/2019 Intramuscular   Manufacturer: [place]   Lot: [id number]   [place]: [id number]

## 2019-12-07 ENCOUNTER — Ambulatory Visit: Payer: Medicare HMO

## 2019-12-19 ENCOUNTER — Ambulatory Visit: Payer: Medicare HMO | Attending: Internal Medicine

## 2019-12-19 ENCOUNTER — Ambulatory Visit: Payer: Medicare HMO

## 2019-12-19 DIAGNOSIS — Z23 Encounter for immunization: Secondary | ICD-10-CM

## 2019-12-19 NOTE — Progress Notes (Signed)
   Covid-19 Vaccination Clinic  Name:  Geneva Leavelle    MRN: [id number] DOB: 05/28/1956  12/19/2019  Mr. Delacerda was observed post Covid-19 immunization for 15 minutes without incident. He was provided with Vaccine Information Sheet and instruction to access the V-Safe system.   Mr. Gotte was instructed to call 911 with any severe reactions post vaccine: Marland Kitchen Difficulty breathing  . Swelling of face and throat  . A fast heartbeat  . A bad rash all over body  . Dizziness and weakness   Immunizations Administered    Name Date Dose VIS Date Route   Pfizer COVID-19 Vaccine 12/19/2019  8:21 AM 0.3 mL 09/29/2019 Intramuscular   Manufacturer: [place]   Lot: [id number]   [place]: [id number]

## 2019-12-20 DIAGNOSIS — Z1331 Encounter for screening for depression: Secondary | ICD-10-CM | POA: Diagnosis not present

## 2019-12-20 DIAGNOSIS — J45909 Unspecified asthma, uncomplicated: Secondary | ICD-10-CM | POA: Diagnosis not present

## 2019-12-20 DIAGNOSIS — H9190 Unspecified hearing loss, unspecified ear: Secondary | ICD-10-CM | POA: Diagnosis not present

## 2019-12-20 DIAGNOSIS — J302 Other seasonal allergic rhinitis: Secondary | ICD-10-CM | POA: Diagnosis not present

## 2019-12-20 DIAGNOSIS — I10 Essential (primary) hypertension: Secondary | ICD-10-CM | POA: Diagnosis not present

## 2019-12-20 DIAGNOSIS — E119 Type 2 diabetes mellitus without complications: Secondary | ICD-10-CM | POA: Diagnosis not present

## 2019-12-20 DIAGNOSIS — M109 Gout, unspecified: Secondary | ICD-10-CM | POA: Diagnosis not present

## 2020-01-03 DIAGNOSIS — R69 Illness, unspecified: Secondary | ICD-10-CM | POA: Diagnosis not present

## 2020-02-04 IMAGING — CT CT PELVIS W/O CM
1 series · 15 of 32 positions shown, 19 images · non-contrast
Comparison: None.

CLINICAL DATA: Right inguinal hernia with pulling sensation and
pain while walking. Symptoms 2 years but getting worse recently.

EXAM:
CT PELVIS WITHOUT CONTRAST
TECHNIQUE: Multidetector CT imaging of the pelvis was performed following the
standard protocol without intravenous contrast.

[Series 2: routine pelvis w/(date) · axial · 0.79mm/px · z∈[-430,-165]mm · 15 of 59 slices shown, 19 images]
[im 4/59  soft-tissue]
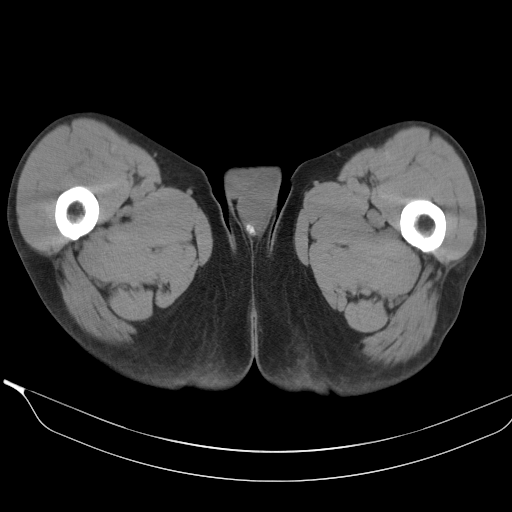
[im 4/59  bone]
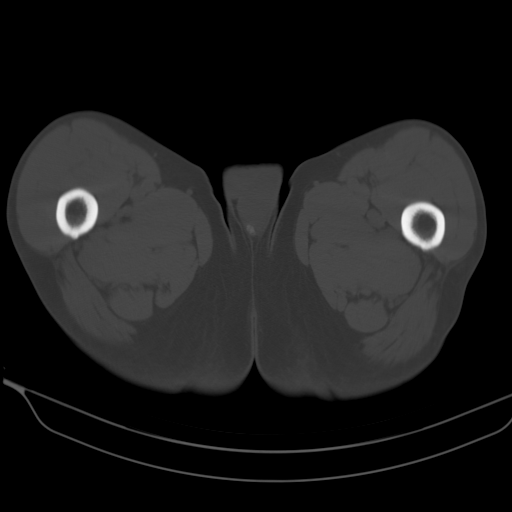
[im 8/59  soft-tissue]
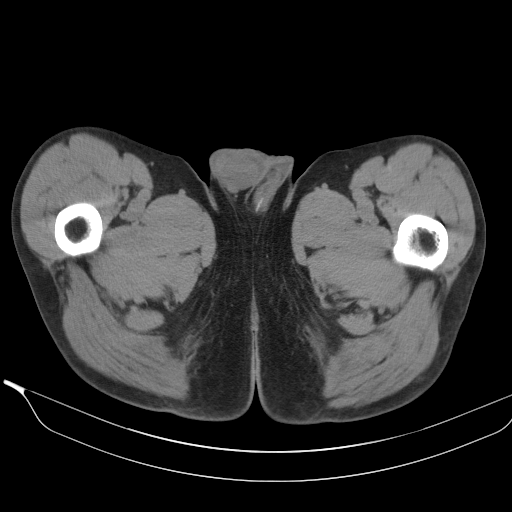
[im 12/59  soft-tissue]
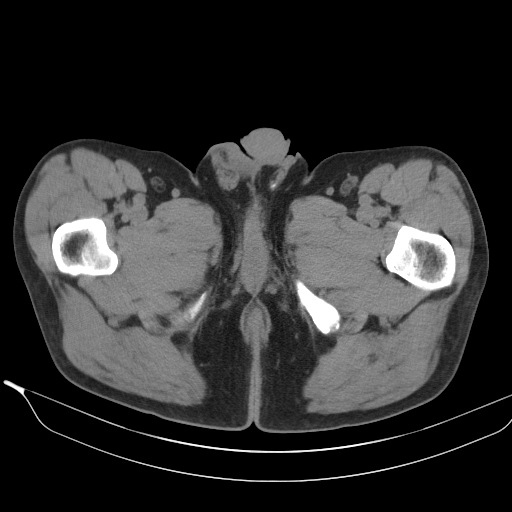
[im 17/59  soft-tissue]
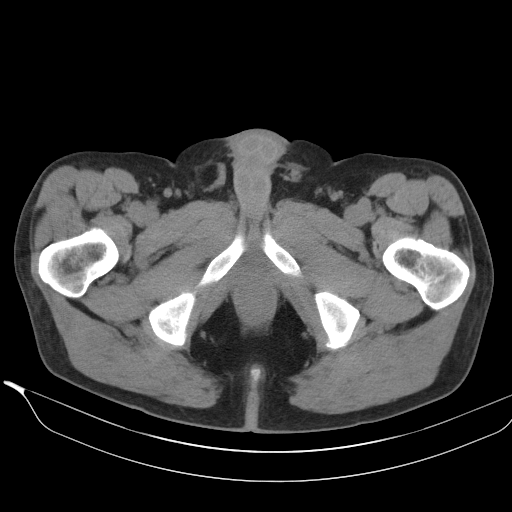
[im 21/59  soft-tissue]
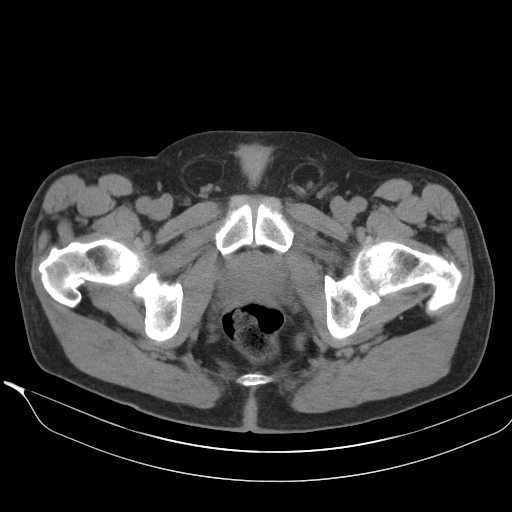
[im 25/59  soft-tissue]
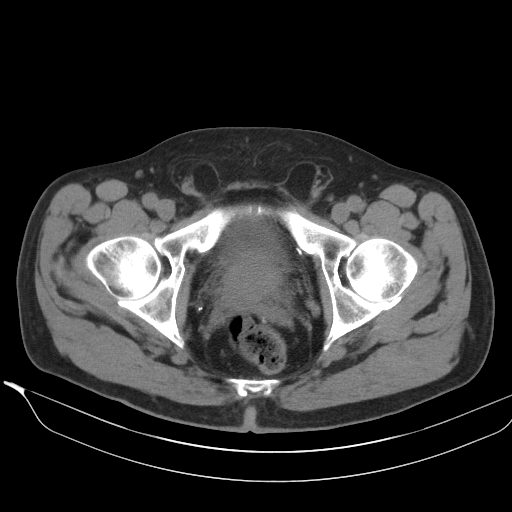
[im 30/59  soft-tissue]
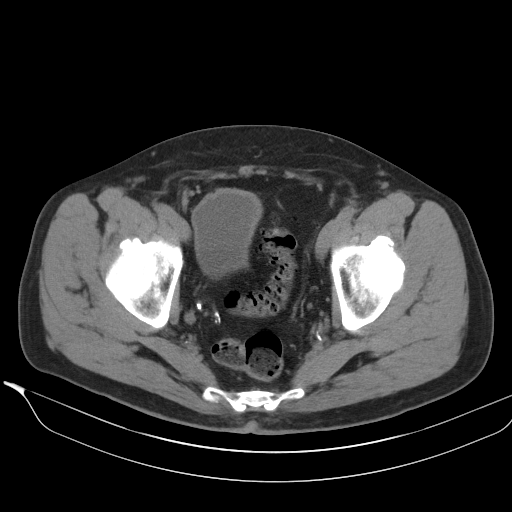
[im 34/59  soft-tissue]
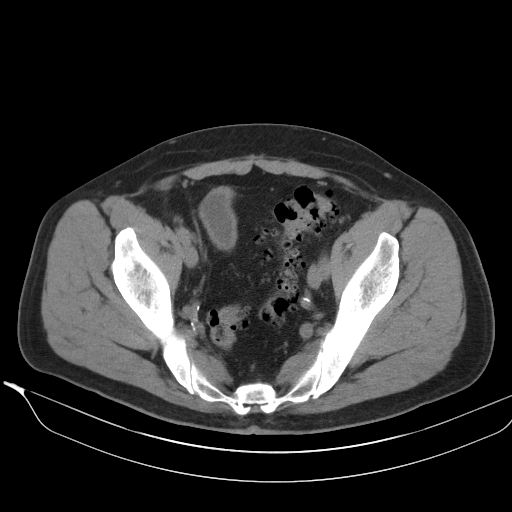
[im 38/59  soft-tissue]
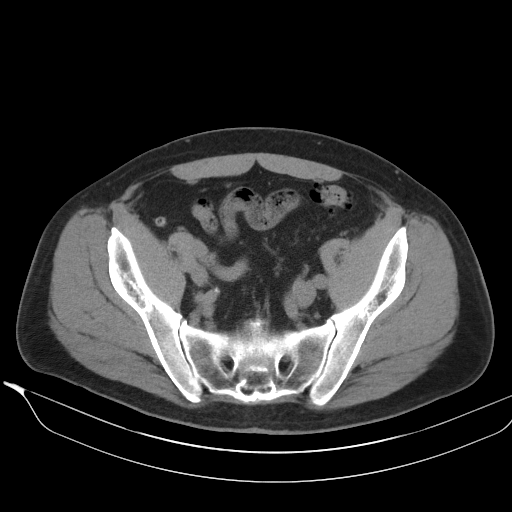
[im 38/59  bone]
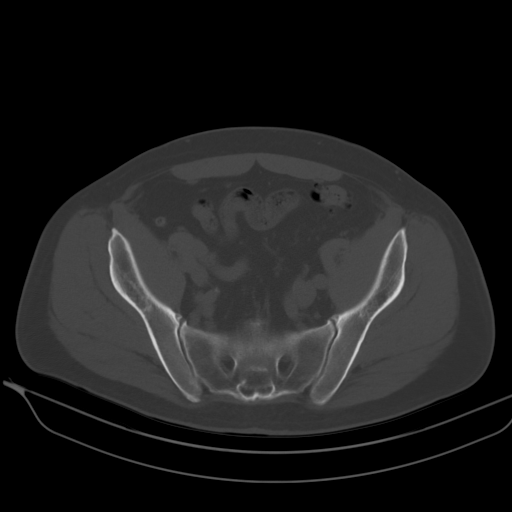
[im 42/59  soft-tissue]
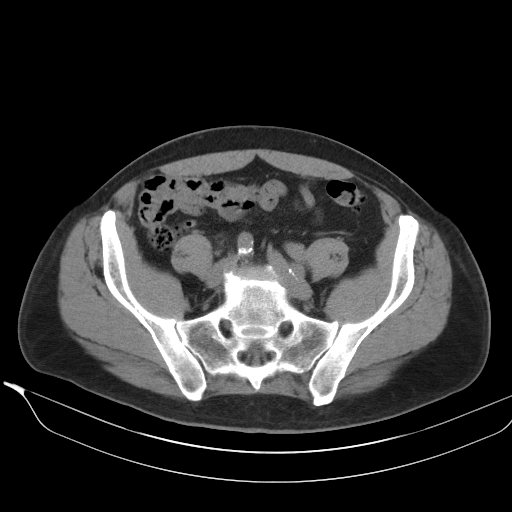
[im 47/59  soft-tissue]
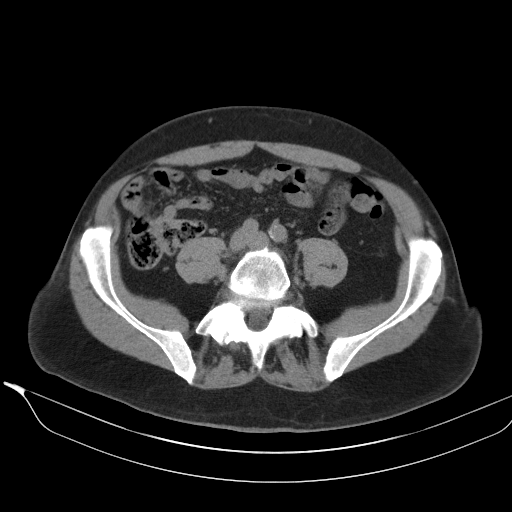
[im 51/59  soft-tissue]
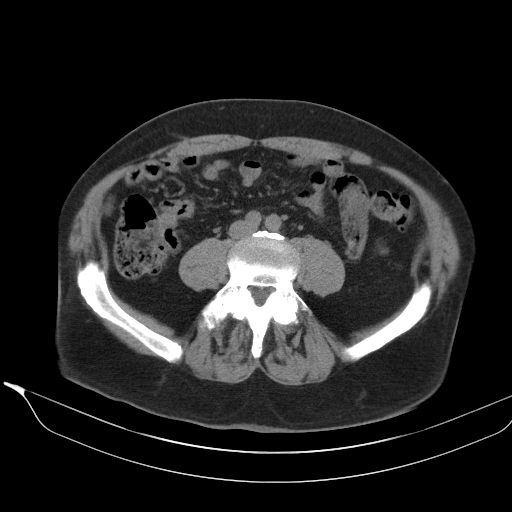
[im 51/59  lung]
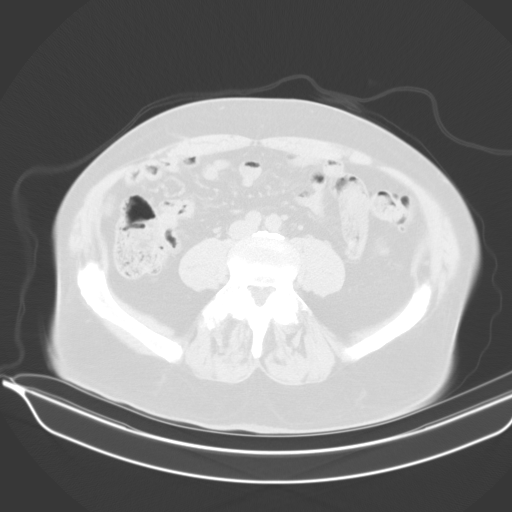
[im 53/59  lung]
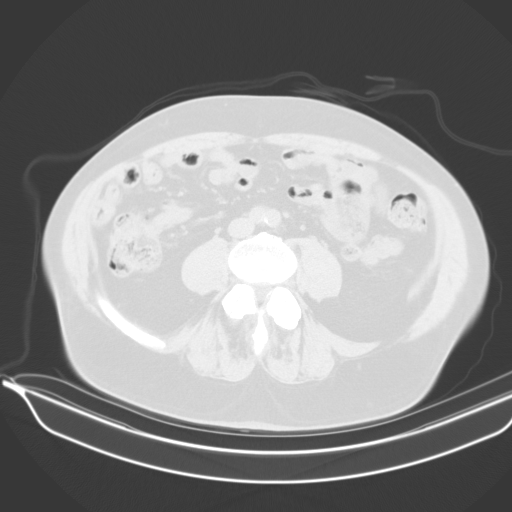
[im 55/59  soft-tissue]
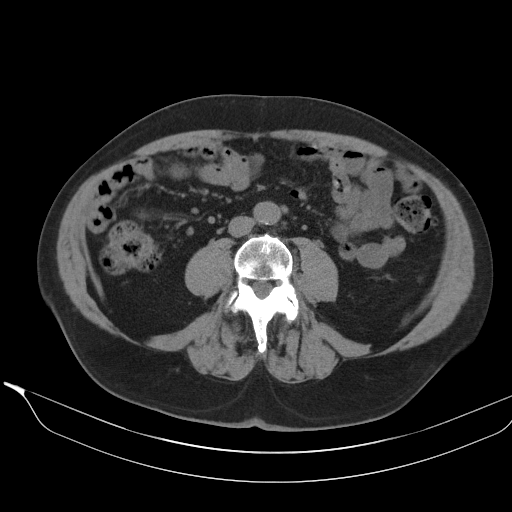
[im 55/59  lung]
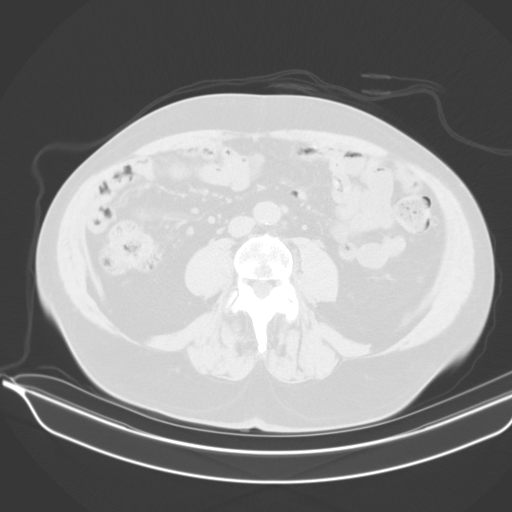
[im 57/59  lung]
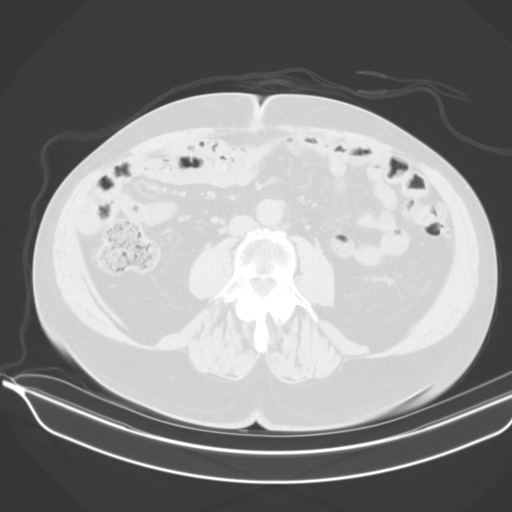

[15 of 32 positions shown; findings below may reference images not displayed]

FINDINGS: Urinary Tract: Left ureter is normal. 5 mm calcification projects
over the distal right ureter. Bladder is unremarkable.

Bowel: Moderate diverticulosis of the colon most prominent over the
sigmoid colon. No acute inflammation. Appendix is normal. Visualized
small bowel is normal.

Vascular/Lymphatic: Calcified plaque over the abdominal aorta. No
adenopathy.

Reproductive:  Unremarkable.

Other: Small bilateral inguinal hernias containing only peritoneal
fat. Bladder extends towards the origin of the right inguinal
hernia.

Musculoskeletal: Mild degenerative change of the spine and hips.
IMPRESSION: 5 mm stone over the distal right ureter. No associated dilatation of
the right ureter. Recommend clinical correlation.

Small bilateral inguinal hernias containing only peritoneal fat.

Colonic diverticulosis.

Aortic Atherosclerosis ([id number]).

## 2020-06-26 DIAGNOSIS — M109 Gout, unspecified: Secondary | ICD-10-CM | POA: Diagnosis not present

## 2020-06-26 DIAGNOSIS — E119 Type 2 diabetes mellitus without complications: Secondary | ICD-10-CM | POA: Diagnosis not present

## 2020-06-26 DIAGNOSIS — E7800 Pure hypercholesterolemia, unspecified: Secondary | ICD-10-CM | POA: Diagnosis not present

## 2020-06-26 DIAGNOSIS — Z125 Encounter for screening for malignant neoplasm of prostate: Secondary | ICD-10-CM | POA: Diagnosis not present

## 2020-07-02 DIAGNOSIS — Z0000 Encounter for general adult medical examination without abnormal findings: Secondary | ICD-10-CM | POA: Diagnosis not present

## 2020-07-02 DIAGNOSIS — E119 Type 2 diabetes mellitus without complications: Secondary | ICD-10-CM | POA: Diagnosis not present

## 2020-07-02 DIAGNOSIS — I10 Essential (primary) hypertension: Secondary | ICD-10-CM | POA: Diagnosis not present

## 2020-07-02 DIAGNOSIS — J45909 Unspecified asthma, uncomplicated: Secondary | ICD-10-CM | POA: Diagnosis not present

## 2020-07-02 DIAGNOSIS — M109 Gout, unspecified: Secondary | ICD-10-CM | POA: Diagnosis not present

## 2020-07-02 DIAGNOSIS — J302 Other seasonal allergic rhinitis: Secondary | ICD-10-CM | POA: Diagnosis not present

## 2020-07-02 DIAGNOSIS — E7800 Pure hypercholesterolemia, unspecified: Secondary | ICD-10-CM | POA: Diagnosis not present

## 2020-07-02 DIAGNOSIS — R82998 Other abnormal findings in urine: Secondary | ICD-10-CM | POA: Diagnosis not present

## 2020-07-04 DIAGNOSIS — R69 Illness, unspecified: Secondary | ICD-10-CM | POA: Diagnosis not present

## 2020-08-12 DIAGNOSIS — R69 Illness, unspecified: Secondary | ICD-10-CM | POA: Diagnosis not present

## 2020-08-20 DIAGNOSIS — R69 Illness, unspecified: Secondary | ICD-10-CM | POA: Diagnosis not present

## 2020-08-21 DIAGNOSIS — E119 Type 2 diabetes mellitus without complications: Secondary | ICD-10-CM | POA: Diagnosis not present

## 2020-08-21 DIAGNOSIS — H02834 Dermatochalasis of left upper eyelid: Secondary | ICD-10-CM | POA: Diagnosis not present

## 2020-08-21 DIAGNOSIS — H04123 Dry eye syndrome of bilateral lacrimal glands: Secondary | ICD-10-CM | POA: Diagnosis not present

## 2020-08-21 DIAGNOSIS — H10413 Chronic giant papillary conjunctivitis, bilateral: Secondary | ICD-10-CM | POA: Diagnosis not present

## 2020-08-21 DIAGNOSIS — H2513 Age-related nuclear cataract, bilateral: Secondary | ICD-10-CM | POA: Diagnosis not present

## 2020-08-21 DIAGNOSIS — H02831 Dermatochalasis of right upper eyelid: Secondary | ICD-10-CM | POA: Diagnosis not present

## 2020-12-25 DIAGNOSIS — R198 Other specified symptoms and signs involving the digestive system and abdomen: Secondary | ICD-10-CM | POA: Diagnosis not present

## 2020-12-25 DIAGNOSIS — R1030 Lower abdominal pain, unspecified: Secondary | ICD-10-CM | POA: Diagnosis not present

## 2021-01-09 DIAGNOSIS — E7800 Pure hypercholesterolemia, unspecified: Secondary | ICD-10-CM | POA: Diagnosis not present

## 2021-01-09 DIAGNOSIS — J302 Other seasonal allergic rhinitis: Secondary | ICD-10-CM | POA: Diagnosis not present

## 2021-01-09 DIAGNOSIS — I10 Essential (primary) hypertension: Secondary | ICD-10-CM | POA: Diagnosis not present

## 2021-01-09 DIAGNOSIS — E119 Type 2 diabetes mellitus without complications: Secondary | ICD-10-CM | POA: Diagnosis not present

## 2021-01-09 DIAGNOSIS — Z1331 Encounter for screening for depression: Secondary | ICD-10-CM | POA: Diagnosis not present

## 2021-01-09 DIAGNOSIS — M109 Gout, unspecified: Secondary | ICD-10-CM | POA: Diagnosis not present

## 2021-01-09 DIAGNOSIS — Z1339 Encounter for screening examination for other mental health and behavioral disorders: Secondary | ICD-10-CM | POA: Diagnosis not present

## 2021-01-09 DIAGNOSIS — J45909 Unspecified asthma, uncomplicated: Secondary | ICD-10-CM | POA: Diagnosis not present

## 2021-07-22 DIAGNOSIS — H2513 Age-related nuclear cataract, bilateral: Secondary | ICD-10-CM | POA: Diagnosis not present

## 2021-07-22 DIAGNOSIS — L080 Pyoderma: Secondary | ICD-10-CM | POA: Diagnosis not present

## 2021-07-22 DIAGNOSIS — H43812 Vitreous degeneration, left eye: Secondary | ICD-10-CM | POA: Diagnosis not present

## 2021-07-22 DIAGNOSIS — H10413 Chronic giant papillary conjunctivitis, bilateral: Secondary | ICD-10-CM | POA: Diagnosis not present

## 2021-07-22 DIAGNOSIS — H02834 Dermatochalasis of left upper eyelid: Secondary | ICD-10-CM | POA: Diagnosis not present

## 2021-07-22 DIAGNOSIS — H04123 Dry eye syndrome of bilateral lacrimal glands: Secondary | ICD-10-CM | POA: Diagnosis not present

## 2021-07-22 DIAGNOSIS — H00022 Hordeolum internum right lower eyelid: Secondary | ICD-10-CM | POA: Diagnosis not present

## 2021-07-22 DIAGNOSIS — H02831 Dermatochalasis of right upper eyelid: Secondary | ICD-10-CM | POA: Diagnosis not present

## 2021-07-22 DIAGNOSIS — E119 Type 2 diabetes mellitus without complications: Secondary | ICD-10-CM | POA: Diagnosis not present

## 2021-07-22 DIAGNOSIS — H43391 Other vitreous opacities, right eye: Secondary | ICD-10-CM | POA: Diagnosis not present

## 2021-07-24 DIAGNOSIS — L821 Other seborrheic keratosis: Secondary | ICD-10-CM | POA: Diagnosis not present

## 2021-07-24 DIAGNOSIS — C4441 Basal cell carcinoma of skin of scalp and neck: Secondary | ICD-10-CM | POA: Diagnosis not present

## 2021-07-24 DIAGNOSIS — D1801 Hemangioma of skin and subcutaneous tissue: Secondary | ICD-10-CM | POA: Diagnosis not present

## 2021-07-24 DIAGNOSIS — C44519 Basal cell carcinoma of skin of other part of trunk: Secondary | ICD-10-CM | POA: Diagnosis not present

## 2021-07-24 DIAGNOSIS — D485 Neoplasm of uncertain behavior of skin: Secondary | ICD-10-CM | POA: Diagnosis not present

## 2021-08-22 DIAGNOSIS — I10 Essential (primary) hypertension: Secondary | ICD-10-CM | POA: Diagnosis not present

## 2021-08-22 DIAGNOSIS — E119 Type 2 diabetes mellitus without complications: Secondary | ICD-10-CM | POA: Diagnosis not present

## 2021-08-22 DIAGNOSIS — M109 Gout, unspecified: Secondary | ICD-10-CM | POA: Diagnosis not present

## 2021-08-22 DIAGNOSIS — Z125 Encounter for screening for malignant neoplasm of prostate: Secondary | ICD-10-CM | POA: Diagnosis not present

## 2021-08-29 DIAGNOSIS — Z1331 Encounter for screening for depression: Secondary | ICD-10-CM | POA: Diagnosis not present

## 2021-08-29 DIAGNOSIS — I10 Essential (primary) hypertension: Secondary | ICD-10-CM | POA: Diagnosis not present

## 2021-08-29 DIAGNOSIS — R82998 Other abnormal findings in urine: Secondary | ICD-10-CM | POA: Diagnosis not present

## 2021-08-29 DIAGNOSIS — J302 Other seasonal allergic rhinitis: Secondary | ICD-10-CM | POA: Diagnosis not present

## 2021-08-29 DIAGNOSIS — I700 Atherosclerosis of aorta: Secondary | ICD-10-CM | POA: Diagnosis not present

## 2021-08-29 DIAGNOSIS — Z1339 Encounter for screening examination for other mental health and behavioral disorders: Secondary | ICD-10-CM | POA: Diagnosis not present

## 2021-08-29 DIAGNOSIS — E119 Type 2 diabetes mellitus without complications: Secondary | ICD-10-CM | POA: Diagnosis not present

## 2021-08-29 DIAGNOSIS — Z0000 Encounter for general adult medical examination without abnormal findings: Secondary | ICD-10-CM | POA: Diagnosis not present

## 2021-08-29 DIAGNOSIS — E7800 Pure hypercholesterolemia, unspecified: Secondary | ICD-10-CM | POA: Diagnosis not present

## 2021-08-29 DIAGNOSIS — J45909 Unspecified asthma, uncomplicated: Secondary | ICD-10-CM | POA: Diagnosis not present

## 2021-08-29 DIAGNOSIS — M109 Gout, unspecified: Secondary | ICD-10-CM | POA: Diagnosis not present

## 2022-03-05 DIAGNOSIS — E119 Type 2 diabetes mellitus without complications: Secondary | ICD-10-CM | POA: Diagnosis not present

## 2022-03-05 DIAGNOSIS — I10 Essential (primary) hypertension: Secondary | ICD-10-CM | POA: Diagnosis not present

## 2022-03-05 DIAGNOSIS — J45909 Unspecified asthma, uncomplicated: Secondary | ICD-10-CM | POA: Diagnosis not present

## 2022-03-05 DIAGNOSIS — Z1339 Encounter for screening examination for other mental health and behavioral disorders: Secondary | ICD-10-CM | POA: Diagnosis not present

## 2022-03-05 DIAGNOSIS — J302 Other seasonal allergic rhinitis: Secondary | ICD-10-CM | POA: Diagnosis not present

## 2022-03-05 DIAGNOSIS — Z1331 Encounter for screening for depression: Secondary | ICD-10-CM | POA: Diagnosis not present

## 2022-03-05 DIAGNOSIS — E7800 Pure hypercholesterolemia, unspecified: Secondary | ICD-10-CM | POA: Diagnosis not present

## 2022-03-05 DIAGNOSIS — M109 Gout, unspecified: Secondary | ICD-10-CM | POA: Diagnosis not present

## 2022-03-05 DIAGNOSIS — I700 Atherosclerosis of aorta: Secondary | ICD-10-CM | POA: Diagnosis not present

## 2022-08-11 DIAGNOSIS — H02831 Dermatochalasis of right upper eyelid: Secondary | ICD-10-CM | POA: Diagnosis not present

## 2022-08-11 DIAGNOSIS — H2513 Age-related nuclear cataract, bilateral: Secondary | ICD-10-CM | POA: Diagnosis not present

## 2022-08-11 DIAGNOSIS — H43391 Other vitreous opacities, right eye: Secondary | ICD-10-CM | POA: Diagnosis not present

## 2022-08-11 DIAGNOSIS — H02834 Dermatochalasis of left upper eyelid: Secondary | ICD-10-CM | POA: Diagnosis not present

## 2022-08-11 DIAGNOSIS — H10413 Chronic giant papillary conjunctivitis, bilateral: Secondary | ICD-10-CM | POA: Diagnosis not present

## 2022-08-11 DIAGNOSIS — D3131 Benign neoplasm of right choroid: Secondary | ICD-10-CM | POA: Diagnosis not present

## 2022-08-11 DIAGNOSIS — H04123 Dry eye syndrome of bilateral lacrimal glands: Secondary | ICD-10-CM | POA: Diagnosis not present

## 2022-08-11 DIAGNOSIS — H43812 Vitreous degeneration, left eye: Secondary | ICD-10-CM | POA: Diagnosis not present

## 2022-08-11 DIAGNOSIS — E119 Type 2 diabetes mellitus without complications: Secondary | ICD-10-CM | POA: Diagnosis not present

## 2022-08-19 DIAGNOSIS — L578 Other skin changes due to chronic exposure to nonionizing radiation: Secondary | ICD-10-CM | POA: Diagnosis not present

## 2022-08-19 DIAGNOSIS — Z85828 Personal history of other malignant neoplasm of skin: Secondary | ICD-10-CM | POA: Diagnosis not present

## 2022-08-19 DIAGNOSIS — D1801 Hemangioma of skin and subcutaneous tissue: Secondary | ICD-10-CM | POA: Diagnosis not present

## 2022-08-19 DIAGNOSIS — L821 Other seborrheic keratosis: Secondary | ICD-10-CM | POA: Diagnosis not present

## 2022-08-19 DIAGNOSIS — L814 Other melanin hyperpigmentation: Secondary | ICD-10-CM | POA: Diagnosis not present

## 2022-08-19 DIAGNOSIS — D485 Neoplasm of uncertain behavior of skin: Secondary | ICD-10-CM | POA: Diagnosis not present

## 2022-08-26 DIAGNOSIS — Z125 Encounter for screening for malignant neoplasm of prostate: Secondary | ICD-10-CM | POA: Diagnosis not present

## 2022-08-26 DIAGNOSIS — I10 Essential (primary) hypertension: Secondary | ICD-10-CM | POA: Diagnosis not present

## 2022-08-26 DIAGNOSIS — M109 Gout, unspecified: Secondary | ICD-10-CM | POA: Diagnosis not present

## 2022-08-26 DIAGNOSIS — R7989 Other specified abnormal findings of blood chemistry: Secondary | ICD-10-CM | POA: Diagnosis not present

## 2022-08-26 DIAGNOSIS — E119 Type 2 diabetes mellitus without complications: Secondary | ICD-10-CM | POA: Diagnosis not present

## 2022-08-26 DIAGNOSIS — E7800 Pure hypercholesterolemia, unspecified: Secondary | ICD-10-CM | POA: Diagnosis not present

## 2022-09-01 DIAGNOSIS — E119 Type 2 diabetes mellitus without complications: Secondary | ICD-10-CM | POA: Diagnosis not present

## 2022-09-01 DIAGNOSIS — Z1212 Encounter for screening for malignant neoplasm of rectum: Secondary | ICD-10-CM | POA: Diagnosis not present

## 2022-09-01 DIAGNOSIS — R82998 Other abnormal findings in urine: Secondary | ICD-10-CM | POA: Diagnosis not present

## 2022-09-01 DIAGNOSIS — I10 Essential (primary) hypertension: Secondary | ICD-10-CM | POA: Diagnosis not present

## 2022-09-02 ENCOUNTER — Other Ambulatory Visit: Payer: Self-pay | Admitting: Internal Medicine

## 2022-09-02 DIAGNOSIS — Z0000 Encounter for general adult medical examination without abnormal findings: Secondary | ICD-10-CM | POA: Diagnosis not present

## 2022-09-02 DIAGNOSIS — R0989 Other specified symptoms and signs involving the circulatory and respiratory systems: Secondary | ICD-10-CM | POA: Diagnosis not present

## 2022-09-02 DIAGNOSIS — M109 Gout, unspecified: Secondary | ICD-10-CM | POA: Diagnosis not present

## 2022-09-02 DIAGNOSIS — I700 Atherosclerosis of aorta: Secondary | ICD-10-CM | POA: Diagnosis not present

## 2022-09-02 DIAGNOSIS — J45909 Unspecified asthma, uncomplicated: Secondary | ICD-10-CM | POA: Diagnosis not present

## 2022-09-02 DIAGNOSIS — E119 Type 2 diabetes mellitus without complications: Secondary | ICD-10-CM | POA: Diagnosis not present

## 2022-09-02 DIAGNOSIS — Z1339 Encounter for screening examination for other mental health and behavioral disorders: Secondary | ICD-10-CM | POA: Diagnosis not present

## 2022-09-02 DIAGNOSIS — Z1331 Encounter for screening for depression: Secondary | ICD-10-CM | POA: Diagnosis not present

## 2022-09-02 DIAGNOSIS — E7800 Pure hypercholesterolemia, unspecified: Secondary | ICD-10-CM | POA: Diagnosis not present

## 2022-09-02 DIAGNOSIS — J302 Other seasonal allergic rhinitis: Secondary | ICD-10-CM | POA: Diagnosis not present

## 2022-09-02 DIAGNOSIS — I10 Essential (primary) hypertension: Secondary | ICD-10-CM | POA: Diagnosis not present

## 2022-09-15 ENCOUNTER — Ambulatory Visit
Admission: RE | Admit: 2022-09-15 | Discharge: 2022-09-15 | Disposition: A | Discharge status: Home / Self Care | Payer: Medicare HMO | Source: Ambulatory Visit | Attending: Internal Medicine | Admitting: Internal Medicine

## 2022-09-15 DIAGNOSIS — R0989 Other specified symptoms and signs involving the circulatory and respiratory systems: Secondary | ICD-10-CM

## 2022-09-15 DIAGNOSIS — I10 Essential (primary) hypertension: Secondary | ICD-10-CM | POA: Diagnosis not present

## 2023-03-09 DIAGNOSIS — R0981 Nasal congestion: Secondary | ICD-10-CM | POA: Diagnosis not present

## 2023-03-09 DIAGNOSIS — J069 Acute upper respiratory infection, unspecified: Secondary | ICD-10-CM | POA: Diagnosis not present

## 2023-03-09 DIAGNOSIS — R5383 Other fatigue: Secondary | ICD-10-CM | POA: Diagnosis not present

## 2023-03-09 DIAGNOSIS — R6883 Chills (without fever): Secondary | ICD-10-CM | POA: Diagnosis not present

## 2023-03-09 DIAGNOSIS — Z1152 Encounter for screening for COVID-19: Secondary | ICD-10-CM | POA: Diagnosis not present

## 2023-03-09 DIAGNOSIS — R059 Cough, unspecified: Secondary | ICD-10-CM | POA: Diagnosis not present

## 2023-03-22 DIAGNOSIS — E119 Type 2 diabetes mellitus without complications: Secondary | ICD-10-CM | POA: Diagnosis not present

## 2023-03-22 DIAGNOSIS — E7800 Pure hypercholesterolemia, unspecified: Secondary | ICD-10-CM | POA: Diagnosis not present

## 2023-03-22 DIAGNOSIS — R0989 Other specified symptoms and signs involving the circulatory and respiratory systems: Secondary | ICD-10-CM | POA: Diagnosis not present

## 2023-03-22 DIAGNOSIS — I700 Atherosclerosis of aorta: Secondary | ICD-10-CM | POA: Diagnosis not present

## 2023-03-22 DIAGNOSIS — J45909 Unspecified asthma, uncomplicated: Secondary | ICD-10-CM | POA: Diagnosis not present

## 2023-03-22 DIAGNOSIS — J302 Other seasonal allergic rhinitis: Secondary | ICD-10-CM | POA: Diagnosis not present

## 2023-03-22 DIAGNOSIS — I10 Essential (primary) hypertension: Secondary | ICD-10-CM | POA: Diagnosis not present

## 2023-03-22 DIAGNOSIS — M109 Gout, unspecified: Secondary | ICD-10-CM | POA: Diagnosis not present

## 2023-05-10 DIAGNOSIS — S80862A Insect bite (nonvenomous), left lower leg, initial encounter: Secondary | ICD-10-CM | POA: Diagnosis not present

## 2023-05-10 DIAGNOSIS — W57XXXA Bitten or stung by nonvenomous insect and other nonvenomous arthropods, initial encounter: Secondary | ICD-10-CM | POA: Diagnosis not present

## 2023-08-18 DIAGNOSIS — H10413 Chronic giant papillary conjunctivitis, bilateral: Secondary | ICD-10-CM | POA: Diagnosis not present

## 2023-08-18 DIAGNOSIS — D3131 Benign neoplasm of right choroid: Secondary | ICD-10-CM | POA: Diagnosis not present

## 2023-08-18 DIAGNOSIS — H43391 Other vitreous opacities, right eye: Secondary | ICD-10-CM | POA: Diagnosis not present

## 2023-08-18 DIAGNOSIS — H04123 Dry eye syndrome of bilateral lacrimal glands: Secondary | ICD-10-CM | POA: Diagnosis not present

## 2023-08-18 DIAGNOSIS — E119 Type 2 diabetes mellitus without complications: Secondary | ICD-10-CM | POA: Diagnosis not present

## 2023-08-18 DIAGNOSIS — H02831 Dermatochalasis of right upper eyelid: Secondary | ICD-10-CM | POA: Diagnosis not present

## 2023-08-18 DIAGNOSIS — H2513 Age-related nuclear cataract, bilateral: Secondary | ICD-10-CM | POA: Diagnosis not present

## 2023-08-18 DIAGNOSIS — H43812 Vitreous degeneration, left eye: Secondary | ICD-10-CM | POA: Diagnosis not present

## 2023-08-18 DIAGNOSIS — H02834 Dermatochalasis of left upper eyelid: Secondary | ICD-10-CM | POA: Diagnosis not present

## 2023-08-23 DIAGNOSIS — D692 Other nonthrombocytopenic purpura: Secondary | ICD-10-CM | POA: Diagnosis not present

## 2023-08-23 DIAGNOSIS — L820 Inflamed seborrheic keratosis: Secondary | ICD-10-CM | POA: Diagnosis not present

## 2023-08-23 DIAGNOSIS — L570 Actinic keratosis: Secondary | ICD-10-CM | POA: Diagnosis not present

## 2023-08-23 DIAGNOSIS — Z85828 Personal history of other malignant neoplasm of skin: Secondary | ICD-10-CM | POA: Diagnosis not present

## 2023-08-23 DIAGNOSIS — D1801 Hemangioma of skin and subcutaneous tissue: Secondary | ICD-10-CM | POA: Diagnosis not present

## 2023-08-23 DIAGNOSIS — L821 Other seborrheic keratosis: Secondary | ICD-10-CM | POA: Diagnosis not present

## 2023-09-06 DIAGNOSIS — E7800 Pure hypercholesterolemia, unspecified: Secondary | ICD-10-CM | POA: Diagnosis not present

## 2023-09-06 DIAGNOSIS — E119 Type 2 diabetes mellitus without complications: Secondary | ICD-10-CM | POA: Diagnosis not present

## 2023-09-06 DIAGNOSIS — Z125 Encounter for screening for malignant neoplasm of prostate: Secondary | ICD-10-CM | POA: Diagnosis not present

## 2023-09-06 DIAGNOSIS — Z1212 Encounter for screening for malignant neoplasm of rectum: Secondary | ICD-10-CM | POA: Diagnosis not present

## 2023-09-06 DIAGNOSIS — Z79899 Other long term (current) drug therapy: Secondary | ICD-10-CM | POA: Diagnosis not present

## 2023-09-06 DIAGNOSIS — R7989 Other specified abnormal findings of blood chemistry: Secondary | ICD-10-CM | POA: Diagnosis not present

## 2023-09-06 DIAGNOSIS — E785 Hyperlipidemia, unspecified: Secondary | ICD-10-CM | POA: Diagnosis not present

## 2023-09-06 DIAGNOSIS — I10 Essential (primary) hypertension: Secondary | ICD-10-CM | POA: Diagnosis not present

## 2023-09-06 DIAGNOSIS — M109 Gout, unspecified: Secondary | ICD-10-CM | POA: Diagnosis not present

## 2023-09-10 DIAGNOSIS — I10 Essential (primary) hypertension: Secondary | ICD-10-CM | POA: Diagnosis not present

## 2023-09-10 DIAGNOSIS — R82998 Other abnormal findings in urine: Secondary | ICD-10-CM | POA: Diagnosis not present

## 2023-09-10 DIAGNOSIS — E119 Type 2 diabetes mellitus without complications: Secondary | ICD-10-CM | POA: Diagnosis not present

## 2023-09-13 DIAGNOSIS — Z1339 Encounter for screening examination for other mental health and behavioral disorders: Secondary | ICD-10-CM | POA: Diagnosis not present

## 2023-09-13 DIAGNOSIS — Z1331 Encounter for screening for depression: Secondary | ICD-10-CM | POA: Diagnosis not present

## 2023-09-13 DIAGNOSIS — J302 Other seasonal allergic rhinitis: Secondary | ICD-10-CM | POA: Diagnosis not present

## 2023-09-13 DIAGNOSIS — Z0000 Encounter for general adult medical examination without abnormal findings: Secondary | ICD-10-CM | POA: Diagnosis not present

## 2023-09-13 DIAGNOSIS — E7800 Pure hypercholesterolemia, unspecified: Secondary | ICD-10-CM | POA: Diagnosis not present

## 2023-09-13 DIAGNOSIS — M109 Gout, unspecified: Secondary | ICD-10-CM | POA: Diagnosis not present

## 2023-09-13 DIAGNOSIS — E119 Type 2 diabetes mellitus without complications: Secondary | ICD-10-CM | POA: Diagnosis not present

## 2023-09-13 DIAGNOSIS — R0989 Other specified symptoms and signs involving the circulatory and respiratory systems: Secondary | ICD-10-CM | POA: Diagnosis not present

## 2023-09-13 DIAGNOSIS — I10 Essential (primary) hypertension: Secondary | ICD-10-CM | POA: Diagnosis not present

## 2023-09-13 DIAGNOSIS — J45909 Unspecified asthma, uncomplicated: Secondary | ICD-10-CM | POA: Diagnosis not present

## 2023-09-13 DIAGNOSIS — I700 Atherosclerosis of aorta: Secondary | ICD-10-CM | POA: Diagnosis not present

## 2023-11-18 DIAGNOSIS — Z09 Encounter for follow-up examination after completed treatment for conditions other than malignant neoplasm: Secondary | ICD-10-CM | POA: Diagnosis not present

## 2023-11-18 DIAGNOSIS — K648 Other hemorrhoids: Secondary | ICD-10-CM | POA: Diagnosis not present

## 2023-11-18 DIAGNOSIS — Z860101 Personal history of adenomatous and serrated colon polyps: Secondary | ICD-10-CM | POA: Diagnosis not present

## 2023-11-18 DIAGNOSIS — K5730 Diverticulosis of large intestine without perforation or abscess without bleeding: Secondary | ICD-10-CM | POA: Diagnosis not present

## 2023-11-18 DIAGNOSIS — D120 Benign neoplasm of cecum: Secondary | ICD-10-CM | POA: Diagnosis not present

## 2023-11-22 DIAGNOSIS — D120 Benign neoplasm of cecum: Secondary | ICD-10-CM | POA: Diagnosis not present

## 2024-07-25 DIAGNOSIS — H903 Sensorineural hearing loss, bilateral: Secondary | ICD-10-CM | POA: Diagnosis not present

## 2024-08-23 DIAGNOSIS — L570 Actinic keratosis: Secondary | ICD-10-CM | POA: Diagnosis not present

## 2024-08-23 DIAGNOSIS — L821 Other seborrheic keratosis: Secondary | ICD-10-CM | POA: Diagnosis not present

## 2024-08-23 DIAGNOSIS — Z85828 Personal history of other malignant neoplasm of skin: Secondary | ICD-10-CM | POA: Diagnosis not present

## 2024-08-23 DIAGNOSIS — D692 Other nonthrombocytopenic purpura: Secondary | ICD-10-CM | POA: Diagnosis not present

## 2024-08-30 DIAGNOSIS — H10413 Chronic giant papillary conjunctivitis, bilateral: Secondary | ICD-10-CM | POA: Diagnosis not present

## 2024-08-30 DIAGNOSIS — H43813 Vitreous degeneration, bilateral: Secondary | ICD-10-CM | POA: Diagnosis not present

## 2024-08-30 DIAGNOSIS — H04123 Dry eye syndrome of bilateral lacrimal glands: Secondary | ICD-10-CM | POA: Diagnosis not present

## 2024-08-30 DIAGNOSIS — H02834 Dermatochalasis of left upper eyelid: Secondary | ICD-10-CM | POA: Diagnosis not present

## 2024-08-30 DIAGNOSIS — H02831 Dermatochalasis of right upper eyelid: Secondary | ICD-10-CM | POA: Diagnosis not present

## 2024-08-30 DIAGNOSIS — E119 Type 2 diabetes mellitus without complications: Secondary | ICD-10-CM | POA: Diagnosis not present

## 2024-08-30 DIAGNOSIS — H2513 Age-related nuclear cataract, bilateral: Secondary | ICD-10-CM | POA: Diagnosis not present

## 2024-08-30 DIAGNOSIS — D3131 Benign neoplasm of right choroid: Secondary | ICD-10-CM | POA: Diagnosis not present

## 2024-09-21 DIAGNOSIS — E7800 Pure hypercholesterolemia, unspecified: Secondary | ICD-10-CM | POA: Diagnosis not present

## 2024-09-21 DIAGNOSIS — E119 Type 2 diabetes mellitus without complications: Secondary | ICD-10-CM | POA: Diagnosis not present

## 2024-09-21 DIAGNOSIS — J45909 Unspecified asthma, uncomplicated: Secondary | ICD-10-CM | POA: Diagnosis not present

## 2024-09-21 DIAGNOSIS — Z1339 Encounter for screening examination for other mental health and behavioral disorders: Secondary | ICD-10-CM | POA: Diagnosis not present

## 2024-09-21 DIAGNOSIS — I700 Atherosclerosis of aorta: Secondary | ICD-10-CM | POA: Diagnosis not present

## 2024-09-21 DIAGNOSIS — Z1331 Encounter for screening for depression: Secondary | ICD-10-CM | POA: Diagnosis not present

## 2024-09-21 DIAGNOSIS — E875 Hyperkalemia: Secondary | ICD-10-CM | POA: Diagnosis not present

## 2024-09-21 DIAGNOSIS — I10 Essential (primary) hypertension: Secondary | ICD-10-CM | POA: Diagnosis not present

## 2024-09-21 DIAGNOSIS — J302 Other seasonal allergic rhinitis: Secondary | ICD-10-CM | POA: Diagnosis not present

## 2024-09-21 DIAGNOSIS — M109 Gout, unspecified: Secondary | ICD-10-CM | POA: Diagnosis not present

## 2024-09-21 DIAGNOSIS — Z0000 Encounter for general adult medical examination without abnormal findings: Secondary | ICD-10-CM | POA: Diagnosis not present

## 2024-09-22 DIAGNOSIS — R82998 Other abnormal findings in urine: Secondary | ICD-10-CM | POA: Diagnosis not present
